# Patient Record
Sex: Female | Born: 1995 | Race: Black or African American | Hispanic: No | Marital: Single | Smoking: Never smoker
Health system: Southern US, Community
[De-identification: ages and names within clinical notes are randomized; demographics above are authoritative.]

---

## 2018-10-06 ENCOUNTER — Emergency Department (HOSPITAL_COMMUNITY)
Admission: EM | Admit: 2018-10-06 | Discharge: 2018-10-06 | Disposition: A | Discharge status: Home / Self Care | Payer: 59 | Attending: Emergency Medicine | Admitting: Emergency Medicine

## 2018-10-06 ENCOUNTER — Encounter (HOSPITAL_COMMUNITY): Payer: Self-pay

## 2018-10-06 ENCOUNTER — Emergency Department (HOSPITAL_COMMUNITY): Payer: 59

## 2018-10-06 DIAGNOSIS — S4991XA Unspecified injury of right shoulder and upper arm, initial encounter: Secondary | ICD-10-CM | POA: Diagnosis present

## 2018-10-06 DIAGNOSIS — S56912A Strain of unspecified muscles, fascia and tendons at forearm level, left arm, initial encounter: Secondary | ICD-10-CM

## 2018-10-06 DIAGNOSIS — S46912A Strain of unspecified muscle, fascia and tendon at shoulder and upper arm level, left arm, initial encounter: Secondary | ICD-10-CM | POA: Insufficient documentation

## 2018-10-06 DIAGNOSIS — Y999 Unspecified external cause status: Secondary | ICD-10-CM | POA: Insufficient documentation

## 2018-10-06 DIAGNOSIS — S46911A Strain of unspecified muscle, fascia and tendon at shoulder and upper arm level, right arm, initial encounter: Secondary | ICD-10-CM

## 2018-10-06 DIAGNOSIS — M79632 Pain in left forearm: Secondary | ICD-10-CM | POA: Diagnosis not present

## 2018-10-06 DIAGNOSIS — Y92410 Unspecified street and highway as the place of occurrence of the external cause: Secondary | ICD-10-CM | POA: Insufficient documentation

## 2018-10-06 DIAGNOSIS — Y9389 Activity, other specified: Secondary | ICD-10-CM | POA: Diagnosis not present

## 2018-10-06 MED ORDER — IBUPROFEN 800 MG PO TABS: 800.0000 mg | ORAL_TABLET | Freq: Three times a day (TID) | ORAL | 0 refills | Status: AC | PRN

## 2018-10-06 MED ORDER — CYCLOBENZAPRINE HCL 10 MG PO TABS: 10.0000 mg | ORAL_TABLET | Freq: Two times a day (BID) | ORAL | 0 refills | Status: AC | PRN

## 2018-10-06 NOTE — ED Notes (Signed)
Patient transported to X-ray 
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
## 2018-10-06 NOTE — ED Notes (Signed)
Pt verbalized understanding of discharge instructions and denies any further questions at this time.   

## 2019-01-28 ENCOUNTER — Other Ambulatory Visit: Payer: Self-pay | Admitting: Urgent Care

## 2019-01-28 ENCOUNTER — Other Ambulatory Visit (HOSPITAL_COMMUNITY): Payer: Self-pay | Admitting: Urgent Care

## 2019-01-28 DIAGNOSIS — R102 Pelvic and perineal pain: Secondary | ICD-10-CM

## 2019-10-22 ENCOUNTER — Ambulatory Visit: Payer: Self-pay | Attending: Internal Medicine

## 2019-10-22 DIAGNOSIS — Z20822 Contact with and (suspected) exposure to covid-19: Secondary | ICD-10-CM

## 2019-10-24 LAB — NOVEL CORONAVIRUS, NAA: SARS-CoV-2, NAA: DETECTED — AB
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
## 2020-02-23 ENCOUNTER — Ambulatory Visit: Payer: No Typology Code available for payment source | Attending: Internal Medicine

## 2020-03-29 ENCOUNTER — Other Ambulatory Visit (HOSPITAL_COMMUNITY): Payer: Self-pay | Admitting: Urgent Care

## 2020-03-29 DIAGNOSIS — R102 Pelvic and perineal pain: Secondary | ICD-10-CM

## 2023-02-04 ENCOUNTER — Encounter (HOSPITAL_BASED_OUTPATIENT_CLINIC_OR_DEPARTMENT_OTHER): Payer: Self-pay

## 2023-02-04 ENCOUNTER — Emergency Department (HOSPITAL_BASED_OUTPATIENT_CLINIC_OR_DEPARTMENT_OTHER): Payer: 59

## 2023-02-04 ENCOUNTER — Emergency Department (HOSPITAL_BASED_OUTPATIENT_CLINIC_OR_DEPARTMENT_OTHER)
Admission: EM | Admit: 2023-02-04 | Discharge: 2023-02-04 | Disposition: A | Discharge status: Home / Self Care | Payer: 59 | Attending: Emergency Medicine | Admitting: Emergency Medicine

## 2023-02-04 ENCOUNTER — Other Ambulatory Visit: Payer: Self-pay

## 2023-02-04 DIAGNOSIS — R519 Headache, unspecified: Secondary | ICD-10-CM | POA: Diagnosis present

## 2023-02-04 DIAGNOSIS — D649 Anemia, unspecified: Secondary | ICD-10-CM | POA: Diagnosis not present

## 2023-02-04 DIAGNOSIS — N939 Abnormal uterine and vaginal bleeding, unspecified: Secondary | ICD-10-CM | POA: Insufficient documentation

## 2023-02-04 LAB — CBC WITH DIFFERENTIAL/PLATELET
Abs Immature Granulocytes: 0.06 10*3/uL (ref 0.00–0.07)
Basophils Absolute: 0 10*3/uL (ref 0.0–0.1)
Basophils Relative: 0 %
Eosinophils Absolute: 0.2 10*3/uL (ref 0.0–0.5)
Eosinophils Relative: 2 %
HCT: 30.7 % — ABNORMAL LOW (ref 36.0–46.0)
Hemoglobin: 9.6 g/dL — ABNORMAL LOW (ref 12.0–15.0)
Immature Granulocytes: 1 %
Lymphocytes Relative: 31 %
Lymphs Abs: 2.9 10*3/uL (ref 0.7–4.0)
MCH: 25.3 pg — ABNORMAL LOW (ref 26.0–34.0)
MCHC: 31.3 g/dL (ref 30.0–36.0)
MCV: 81 fL (ref 80.0–100.0)
Monocytes Absolute: 1.3 10*3/uL — ABNORMAL HIGH (ref 0.1–1.0)
Monocytes Relative: 14 %
Neutro Abs: 4.8 10*3/uL (ref 1.7–7.7)
Neutrophils Relative %: 52 %
Platelets: 470 10*3/uL — ABNORMAL HIGH (ref 150–400)
RBC: 3.79 MIL/uL — ABNORMAL LOW (ref 3.87–5.11)
RDW: 18.2 % — ABNORMAL HIGH (ref 11.5–15.5)
WBC: 9.3 10*3/uL (ref 4.0–10.5)
nRBC: 0 % (ref 0.0–0.2)

## 2023-02-04 LAB — BASIC METABOLIC PANEL
Anion gap: 8 (ref 5–15)
BUN: 13 mg/dL (ref 6–20)
CO2: 25 mmol/L (ref 22–32)
Calcium: 8.9 mg/dL (ref 8.9–10.3)
Chloride: 105 mmol/L (ref 98–111)
Creatinine, Ser: 0.6 mg/dL (ref 0.44–1.00)
GFR, Estimated: >60 mL/min (ref >60)
Glucose, Bld: 107 mg/dL — ABNORMAL HIGH (ref 70–99)
Potassium: 3.9 mmol/L (ref 3.5–5.1)
Sodium: 138 mmol/L (ref 135–145)

## 2023-02-04 LAB — HCG, SERUM, QUALITATIVE: Preg, Serum: NEGATIVE

## 2023-02-04 MED ORDER — SODIUM CHLORIDE 0.9 % IV BOLUS
1000.0000 mL | Freq: Once | INTRAVENOUS | Status: AC
  Administered 2023-02-04: 1000 mL via INTRAVENOUS

## 2023-02-04 MED ORDER — KETOROLAC TROMETHAMINE 30 MG/ML IJ SOLN
30.0000 mg | Freq: Once | INTRAMUSCULAR | Status: AC
  Administered 2023-02-04: 30 mg via INTRAVENOUS
  Filled 2023-02-04: qty 1

## 2023-02-04 MED ORDER — FERROUS SULFATE 325 (65 FE) MG PO TABS: 325.0000 mg | ORAL_TABLET | Freq: Two times a day (BID) | ORAL | 0 refills | Status: AC

## 2023-02-04 NOTE — ED Provider Notes (Signed)
[facility] EMERGENCY DEPARTMENT AT [facility] Provider Note   CSN: [id number] Arrival date & time: 02/04/23  0139     History  No chief complaint on file.   Laurie Webb is a 27 y.o. female.  Patient is a 27 year old female with history of dysfunctional uterine bleeding.  Patient presenting today with several complaints.  She reports headache to the right occipital region that has been ongoing for the past several days.  She denies associated nausea, visual disturbances, fever, or stiff neck.  Patient also complaining that she had persistent vaginal bleeding for the past 2 months and that this just stopped today.  She also reports having had a nosebleed a couple of days ago.  She is concerned that the vaginal bleeding and the nosebleed that there may be a problem with her blood counts.  No aggravating or alleviating factors.  She does have an appointment with her GYN upcoming in the next month.  The history is provided by the patient.       Home Medications Prior to Admission medications   Medication Sig Start Date End Date Taking? Authorizing Provider  cyclobenzaprine (FLEXERIL) 10 MG tablet Take 1 tablet (10 mg total) by mouth 2 (two) times daily as needed for muscle spasms. 10/06/18   Tilden Fossa, MD  ibuprofen (ADVIL,MOTRIN) 800 MG tablet Take 1 tablet (800 mg total) by mouth every 8 (eight) hours as needed. 10/06/18   Tilden Fossa, MD      Allergies    Patient has no known allergies.    Review of Systems   Review of Systems  All other systems reviewed and are negative.   Physical Exam Updated Vital Signs BP (!) 141/85   Pulse (!) 109   Temp 98.4 F (36.9 C)   Resp 16   SpO2 96%  Physical Exam Vitals and nursing note reviewed.  Constitutional:      General: She is not in acute distress.    Appearance: She is well-developed. She is not diaphoretic.  HENT:     Head: Normocephalic and atraumatic.  Eyes:     Extraocular Movements: Extraocular  movements intact.     Pupils: Pupils are equal, round, and reactive to light.  Cardiovascular:     Rate and Rhythm: Normal rate and regular rhythm.     Heart sounds: No murmur heard.    No friction rub. No gallop.  Pulmonary:     Effort: Pulmonary effort is normal. No respiratory distress.     Breath sounds: Normal breath sounds. No wheezing.  Abdominal:     General: Bowel sounds are normal. There is no distension.     Palpations: Abdomen is soft.     Tenderness: There is no abdominal tenderness.  Musculoskeletal:        General: Normal range of motion.     Cervical back: Normal range of motion and neck supple.  Skin:    General: Skin is warm and dry.  Neurological:     General: No focal deficit present.     Mental Status: She is alert and oriented to person, place, and time.     Cranial Nerves: No cranial nerve deficit.     Motor: No weakness.     ED Results / Procedures / Treatments   Labs (all labs ordered are listed, but only abnormal results are displayed) Labs Reviewed  BASIC METABOLIC PANEL  CBC WITH DIFFERENTIAL/PLATELET  HCG, SERUM, QUALITATIVE    EKG None  Radiology No results found.  Procedures Procedures    Medications Ordered in ED Medications  sodium chloride 0.9 % bolus 1,000 mL (has no administration in time range)  ketorolac (TORADOL) 30 MG/ML injection 30 mg (has no administration in time range)    ED Course/ Medical Decision Making/ A&P  Patient is a 27 year old female presenting with vaginal bleeding and headache as described in the HPI.  She arrives here with stable vital signs.  She is afebrile and neurologically intact.  Workup initiated including CBC, basic metabolic panel, and hCG.  Patient does have a hemoglobin of 9.6, but laboratory studies otherwise unremarkable.  Pregnancy test is negative.  I did elect to obtain a head CT which showed no acute abnormality.  Patient has received IV fluids along with Toradol and seems to be  feeling better.  Patient will be advised to begin taking iron supplementation and follow-up with her GYN.  Nothing today appears emergent and she is not actively bleeding.  Final Clinical Impression(s) / ED Diagnoses Final diagnoses:  None    Rx / DC Orders ED Discharge Orders     None         Geoffery Lyons, MD 02/04/23 [phone]

## 2023-02-04 NOTE — Discharge Instructions (Addendum)
Take ibuprofen 600 mg every 6 hours as needed for pain.  Begin taking iron supplement as prescribed.  Follow-up with your GYN as scheduled, sooner if bleeding recurs.

## 2023-02-04 NOTE — ED Triage Notes (Signed)
Pt c/o R occipital HA into neck pain "that just won't go away for the last couple of days." Also states her period has "been on for like 2mos, but it's not on now." States she had a nosebleed 2 days ago. Denies neuro hx, denies N/V, vision changes, weakness. No neuro deficit noted in triage.   PCP appt next month, "but I just couldn't wait."

## 2023-02-06 ENCOUNTER — Other Ambulatory Visit: Payer: Self-pay

## 2023-07-16 ENCOUNTER — Other Ambulatory Visit (HOSPITAL_COMMUNITY)
Admission: RE | Admit: 2023-07-16 | Discharge: 2023-07-16 | Disposition: A | Discharge status: Home / Self Care | Payer: 59 | Source: Ambulatory Visit | Attending: Nurse Practitioner | Admitting: Nurse Practitioner

## 2023-07-16 ENCOUNTER — Other Ambulatory Visit: Payer: Self-pay | Admitting: Nurse Practitioner

## 2023-07-16 ENCOUNTER — Other Ambulatory Visit: Payer: Self-pay

## 2023-07-16 DIAGNOSIS — Z124 Encounter for screening for malignant neoplasm of cervix: Secondary | ICD-10-CM | POA: Insufficient documentation

## 2023-07-16 MED ORDER — NORGESTIMATE-ETH ESTRADIOL 0.25-35 MG-MCG PO TABS
1.0000 | ORAL_TABLET | Freq: Every day | ORAL | 4 refills | Status: AC
  Filled 2023-07-16: qty 28, 28d supply, fill #0
  Filled 2023-08-08: qty 28, 28d supply, fill #1
  Filled 2023-09-15: qty 28, 28d supply, fill #2
  Filled 2023-10-07: qty 28, 28d supply, fill #3
  Filled 2023-10-30: qty 28, 28d supply, fill #4
  Filled 2023-12-03: qty 28, 28d supply, fill #5
  Filled 2023-12-30: qty 28, 28d supply, fill #6
  Filled 2024-05-13: qty 28, 28d supply, fill #7

## 2023-07-18 LAB — CYTOLOGY - PAP: Diagnosis: NEGATIVE

## 2023-08-13 ENCOUNTER — Other Ambulatory Visit: Payer: Self-pay

## 2023-09-15 ENCOUNTER — Other Ambulatory Visit: Payer: Self-pay

## 2023-10-09 ENCOUNTER — Other Ambulatory Visit: Payer: Self-pay

## 2023-10-30 ENCOUNTER — Other Ambulatory Visit: Payer: Self-pay

## 2023-10-31 ENCOUNTER — Other Ambulatory Visit: Payer: Self-pay

## 2023-12-04 ENCOUNTER — Other Ambulatory Visit: Payer: Self-pay

## 2023-12-30 ENCOUNTER — Other Ambulatory Visit: Payer: Self-pay

## 2023-12-30 MED ORDER — AMOXICILLIN 875 MG PO TABS
875.0000 mg | ORAL_TABLET | Freq: Two times a day (BID) | ORAL | 0 refills | Status: AC
  Filled 2023-12-30: qty 20, 10d supply, fill #0

## 2024-01-05 ENCOUNTER — Other Ambulatory Visit: Payer: Self-pay

## 2024-05-13 ENCOUNTER — Other Ambulatory Visit: Payer: Self-pay

## 2024-05-18 ENCOUNTER — Other Ambulatory Visit: Payer: Self-pay
